# Patient Record
Sex: Male | Born: 2000 | Race: Black or African American | Hispanic: No | Marital: Single | Smoking: Never smoker
Health system: Southern US, Community
[De-identification: ages and names within clinical notes are randomized; demographics above are authoritative.]

---
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
## 2020-05-16 ENCOUNTER — Emergency Department (HOSPITAL_BASED_OUTPATIENT_CLINIC_OR_DEPARTMENT_OTHER)
Admission: EM | Admit: 2020-05-16 | Discharge: 2020-05-16 | Disposition: A | Discharge status: Home / Self Care | Payer: 59 | Attending: Emergency Medicine | Admitting: Emergency Medicine

## 2020-05-16 ENCOUNTER — Other Ambulatory Visit: Payer: Self-pay

## 2020-05-16 ENCOUNTER — Encounter (HOSPITAL_BASED_OUTPATIENT_CLINIC_OR_DEPARTMENT_OTHER): Payer: Self-pay | Admitting: Emergency Medicine

## 2020-05-16 DIAGNOSIS — A749 Chlamydial infection, unspecified: Secondary | ICD-10-CM | POA: Diagnosis present

## 2020-05-16 DIAGNOSIS — Z202 Contact with and (suspected) exposure to infections with a predominantly sexual mode of transmission: Secondary | ICD-10-CM | POA: Diagnosis not present

## 2020-05-16 DIAGNOSIS — Z91040 Latex allergy status: Secondary | ICD-10-CM | POA: Insufficient documentation

## 2020-05-16 MED ORDER — LIDOCAINE HCL (PF) 1 % IJ SOLN
INTRAMUSCULAR | Status: AC
  Filled 2020-05-16: qty 5

## 2020-05-16 MED ORDER — CEFTRIAXONE SODIUM 500 MG IJ SOLR
500.0000 mg | Freq: Once | INTRAMUSCULAR | Status: AC
  Administered 2020-05-16: 500 mg via INTRAMUSCULAR
  Filled 2020-05-16: qty 500

## 2020-05-16 MED ORDER — DOXYCYCLINE HYCLATE 100 MG PO CAPS: 100.0000 mg | ORAL_CAPSULE | Freq: Two times a day (BID) | ORAL | 0 refills | Status: AC

## 2020-05-16 NOTE — Discharge Instructions (Addendum)
You have been treated for gonorrhea and chlamydia.  Please finish treatment course before engaging in any sexual activity.  Please consider condoms.  Make sure any sex partners are told that they need treatment as well.

## 2020-05-16 NOTE — ED Provider Notes (Signed)
[facility] EMERGENCY DEPARTMENT Provider Note   CSN: [id number] Arrival date & time: 05/16/20  1345     History Chief Complaint  Patient presents with   STD Check    Omar Knight is a 19 y.o. male.  Patient here for treatment for chlamydia.  Has had some discharge.  The history is provided by the patient.  Exposure to STD This is a new problem. The problem occurs constantly. The problem has not changed since onset.Nothing aggravates the symptoms. Nothing relieves the symptoms. He has tried nothing for the symptoms. The treatment provided no relief.       History reviewed. No pertinent past medical history.  There are no problems to display for this patient.   History reviewed. No pertinent surgical history.     History reviewed. No pertinent family history.  Social History   Tobacco Use   Smoking status: Never Smoker   Smokeless tobacco: Never Used  Substance Use Topics   Alcohol use: No   Drug use: No    Home Medications Prior to Admission medications   Medication Sig Start Date End Date Taking? Authorizing Provider  doxycycline (VIBRAMYCIN) 100 MG capsule Take 1 capsule (100 mg total) by mouth 2 (two) times daily for 7 days. 05/16/20 05/23/20  Omar Norfolk, DO    Allergies    Latex  Review of Systems   Review of Systems  Genitourinary: Positive for discharge and dysuria. Negative for decreased urine volume, difficulty urinating, frequency, genital sores, penile pain, penile swelling, scrotal swelling, testicular pain and urgency.  Skin: Negative for color change, pallor, rash and wound.    Physical Exam Updated Vital Signs  ED Triage Vitals  Enc Vitals Group     BP 05/16/20 1358 111/61     Pulse Rate 05/16/20 1358 63     Resp 05/16/20 1358 18     Temp 05/16/20 1358 99.1 F (37.3 C)     Temp Source 05/16/20 1358 Oral     SpO2 05/16/20 1358 100 %     Weight --      Height --      Head Circumference --      Peak Flow --        Pain Score 05/16/20 1359 0     Pain Loc --      Pain Edu? --      Excl. in GC? --     Physical Exam Constitutional:      General: He is not in acute distress.    Appearance: He is not ill-appearing.  Genitourinary:    Penis: Normal.      Testes: Normal.        Right: Mass or tenderness not present.        Left: Mass or tenderness not present.     Epididymis:     Right: Normal.     Left: Normal.  Skin:    General: Skin is warm.     Findings: No lesion or rash.  Neurological:     Mental Status: He is alert.     ED Results / Procedures / Treatments   Labs (all labs ordered are listed, but only abnormal results are displayed) Labs Reviewed  GC/CHLAMYDIA PROBE AMP ([place]) NOT AT [facility]    EKG None  Radiology No results found.  Procedures Procedures (including critical care time)  Medications Ordered in ED Medications  cefTRIAXone (ROCEPHIN) injection 500 mg (has no administration in time range)    ED Course  I have reviewed the triage vital signs and the nursing notes.  Pertinent labs & imaging results that were available during my care of the patient were reviewed by me and considered in my medical decision making (see chart for details).    MDM Rules/Calculators/A&P                          Omar Knight is a 19 year old male who presents to the ED for treatment for chlamydia.  Normal vitals.  No fever.  States that he had exposure to chlamydia.  Has had some discharge.  GU exam is overall unremarkable.  Treated with Rocephin and doxycycline.  Educated about STDs and safe sex.  Knows that sex partners need to be treated and that he needs to complete treatment prior to any sexual activity.  Discharged in good condition.  This chart was dictated using voice recognition software.  Despite best efforts to proofread,  errors can occur which can change the documentation meaning.    Final Clinical Impression(s) / ED Diagnoses Final diagnoses:  Exposure to  STD    Rx / DC Orders ED Discharge Orders         Ordered    doxycycline (VIBRAMYCIN) 100 MG capsule  2 times daily        05/16/20 1430           Omar Knight, Omar Bhat, DO 05/16/20 1438

## 2020-05-16 NOTE — ED Triage Notes (Signed)
Exposure to unknown STD. Discharge x 1 week.

## 2020-05-16 NOTE — ED Notes (Signed)
Tolerated IM injection well, AVS and Rx provided to client, discussed safe sex practices and also to refrain from sexual activity until completion of Rx provided by provider. Opportunity for questions provided. Copy of AVS provided

## 2020-05-18 LAB — GC/CHLAMYDIA PROBE AMP (~~LOC~~) NOT AT ARMC
Chlamydia: POSITIVE — AB
Comment: NEGATIVE
Comment: NORMAL
Neisseria Gonorrhea: POSITIVE — AB

## 2020-09-17 ENCOUNTER — Emergency Department (HOSPITAL_BASED_OUTPATIENT_CLINIC_OR_DEPARTMENT_OTHER)
Admission: EM | Admit: 2020-09-17 | Discharge: 2020-09-17 | Disposition: A | Discharge status: Home / Self Care | Payer: 59 | Attending: Emergency Medicine | Admitting: Emergency Medicine

## 2020-09-17 ENCOUNTER — Other Ambulatory Visit: Payer: Self-pay

## 2020-09-17 ENCOUNTER — Encounter (HOSPITAL_BASED_OUTPATIENT_CLINIC_OR_DEPARTMENT_OTHER): Payer: Self-pay | Admitting: *Deleted

## 2020-09-17 DIAGNOSIS — Z202 Contact with and (suspected) exposure to infections with a predominantly sexual mode of transmission: Secondary | ICD-10-CM | POA: Diagnosis not present

## 2020-09-17 DIAGNOSIS — Z91040 Latex allergy status: Secondary | ICD-10-CM | POA: Insufficient documentation

## 2020-09-17 LAB — URINALYSIS, ROUTINE W REFLEX MICROSCOPIC
Bilirubin Urine: NEGATIVE
Glucose, UA: NEGATIVE mg/dL
Hgb urine dipstick: NEGATIVE
Ketones, ur: NEGATIVE mg/dL
Leukocytes,Ua: NEGATIVE
Nitrite: NEGATIVE
Protein, ur: NEGATIVE mg/dL
Specific Gravity, Urine: 1.005 (ref 1.005–1.030)
pH: 8.5 — ABNORMAL HIGH (ref 5.0–8.0)

## 2020-09-17 MED ORDER — DOXYCYCLINE HYCLATE 100 MG PO TABS
100.0000 mg | ORAL_TABLET | Freq: Once | ORAL | Status: AC
  Administered 2020-09-17: 100 mg via ORAL
  Filled 2020-09-17: qty 1

## 2020-09-17 MED ORDER — CEFTRIAXONE SODIUM 500 MG IJ SOLR
500.0000 mg | Freq: Once | INTRAMUSCULAR | Status: AC
  Administered 2020-09-17: 500 mg via INTRAMUSCULAR
  Filled 2020-09-17: qty 500

## 2020-09-17 MED ORDER — DOXYCYCLINE HYCLATE 100 MG PO CAPS: 100.0000 mg | ORAL_CAPSULE | Freq: Two times a day (BID) | ORAL | 0 refills | Status: AC

## 2020-09-17 NOTE — ED Provider Notes (Addendum)
[facility] EMERGENCY DEPARTMENT Provider Note   CSN: [id number] Arrival date & time: 09/17/20  1846     History Chief Complaint  Patient presents with  . Exposure to STD    Omar Knight is a 20 y.o. male without significant PMHx, presenting to the ED with complaint of exposure to chlamydia. He is sexually active with male partners, last encounter was without protection. Denies any associated symptoms.  Specifically asked about abdominal pain which was mentioned on intake, patient denies.   The history is provided by the patient.       History reviewed. No pertinent past medical history.  There are no problems to display for this patient.   History reviewed. No pertinent surgical history.     No family history on file.  Social History   Tobacco Use  . Smoking status: Never Smoker  . Smokeless tobacco: Never Used  Substance Use Topics  . Alcohol use: No  . Drug use: No    Home Medications Prior to Admission medications   Medication Sig Start Date End Date Taking? Authorizing Provider  doxycycline (VIBRAMYCIN) 100 MG capsule Take 1 capsule (100 mg total) by mouth 2 (two) times daily for 7 days. 09/17/20 09/24/20 Yes Trebor Galdamez, [place], PA-C    Allergies    Latex  Review of Systems   Review of Systems  All other systems reviewed and are negative.   Physical Exam Updated Vital Signs BP (!) 131/52 (BP Location: Right Arm)   Pulse 81   Temp 98.5 F (36.9 C) (Oral)   Resp 20   Ht 5\' 8"  (1.727 m)   Wt 55.3 kg   SpO2 99%   BMI 18.55 kg/m   Physical Exam Vitals and nursing note reviewed. Exam conducted with a chaperone present.  Constitutional:      General: He is not in acute distress.    Appearance: He is well-developed.  HENT:     Head: Normocephalic and atraumatic.  Eyes:     Conjunctiva/sclera: Conjunctivae normal.  Cardiovascular:     Rate and Rhythm: Normal rate.  Pulmonary:     Effort: Pulmonary effort is normal.   Genitourinary:    Penis: Circumcised.      Testes: Normal.     Epididymis:     Right: Normal.     Left: Normal.     Comments: Clear discharge present at the urethral meatus No tenderness to palpation of the penis, no rashes or lesions. No testicular/epididymal tenderness or swelling.  Scrotum appears normal. Neurological:     Mental Status: He is alert.  Psychiatric:        Mood and Affect: Mood normal.        Behavior: Behavior normal.     ED Results / Procedures / Treatments   Labs (all labs ordered are listed, but only abnormal results are displayed) Labs Reviewed  URINALYSIS, ROUTINE W REFLEX MICROSCOPIC - Abnormal; Notable for the following components:      Result Value   APPearance HAZY (*)    pH 8.5 (*)    All other components within normal limits  GC/CHLAMYDIA PROBE AMP ([place]) NOT AT [facility]    EKG None  Radiology No results found.  Procedures Procedures   Medications Ordered in ED Medications  cefTRIAXone (ROCEPHIN) injection 500 mg (has no administration in time range)  doxycycline (VIBRA-TABS) tablet 100 mg (has no administration in time range)    ED Course  I have reviewed the triage vital signs and  the nursing notes.  Pertinent labs & imaging results that were available during my care of the patient were reviewed by me and considered in my medical decision making (see chart for details).    MDM Rules/Calculators/A&P                          Patient is asymptomatic, presenting for exposure to chlamydia. .  No tenderness to palpation of the testes or epididymis to suggest orchitis or epididymitis.  STD cultures obtained including gonorrhea and chlamydia.  UA neg for trich. Per review of medical record, patient's STD screening for gonorrhea and chlamydia were both positive in October 2021.  He states he completed his treatment for which she was prescribed a dose of IM Rocephin and p.o. doxycycline.    Patient to be discharged with instructions to  follow up with PCP. Discussed importance of using protection when sexually active. Pt understands that they have GC/Chlamydia cultures pending and that they will need to inform all sexual partners if results return positive. Patient has been treated prophylactically today with doxycycline and Rocephin.   Discussed results, findings, treatment and follow up. Patient advised of return precautions. Patient verbalized understanding and agreed with plan.  Final Clinical Impression(s) / ED Diagnoses Final diagnoses:  Exposure to STD    Rx / DC Orders ED Discharge Orders         Ordered    doxycycline (VIBRAMYCIN) 100 MG capsule  2 times daily        09/17/20 1924           Rande Dario, [place], PA-C 09/17/20 1922    Ardis Lawley, [place] N, [place] 09/17/20 1925    Virgina Norfolk, DO 09/17/20 1937
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
## 2020-09-17 NOTE — Discharge Instructions (Addendum)
Please consider practicing safe sexual activity to prevent the potential of contracting lifelong and life-threatening sexually transmitted diseases. Talk with your primary care provider about any new medications.  You have begun treatment today for gonorrhea and chlamydia.  It is important you finish the antibiotic, doxycycline, to complete your treatment.  Take this once every 12 hours until gone.  It is important that you protect your skin from the sun if you plan for any prolonged sun exposure while taking this medication. A side effect of this medication includes increasing your skin sensitivity to UV rays and can result in rash. You will receive a call from the hospital if your test results come back positive. Avoid sexual activity until you know your test results. If your results come back positive, it is important that you inform all of your sexual partners.  Return to the ER for new pain with bowel movements, abdominal pain, fever, swelling/pain in your testicles, or new or worsening symptoms.

## 2020-09-17 NOTE — ED Triage Notes (Signed)
STD exposure. Denies penile discharge.

## 2020-09-18 LAB — GC/CHLAMYDIA PROBE AMP (~~LOC~~) NOT AT ARMC
Chlamydia: NEGATIVE
Comment: NEGATIVE
Comment: NORMAL
Neisseria Gonorrhea: NEGATIVE

## 2022-11-19 ENCOUNTER — Encounter (HOSPITAL_BASED_OUTPATIENT_CLINIC_OR_DEPARTMENT_OTHER): Payer: Self-pay | Admitting: Emergency Medicine

## 2022-11-19 ENCOUNTER — Emergency Department (HOSPITAL_BASED_OUTPATIENT_CLINIC_OR_DEPARTMENT_OTHER)
Admission: EM | Admit: 2022-11-19 | Discharge: 2022-11-19 | Disposition: A | Discharge status: Home / Self Care | Payer: 59 | Attending: Emergency Medicine | Admitting: Emergency Medicine

## 2022-11-19 DIAGNOSIS — H16001 Unspecified corneal ulcer, right eye: Secondary | ICD-10-CM | POA: Diagnosis not present

## 2022-11-19 DIAGNOSIS — H5711 Ocular pain, right eye: Secondary | ICD-10-CM | POA: Diagnosis present

## 2022-11-19 MED ORDER — FLUORESCEIN SODIUM 1 MG OP STRP
1.0000 | ORAL_STRIP | Freq: Once | OPHTHALMIC | Status: AC
  Administered 2022-11-19: 1 via OPHTHALMIC
  Filled 2022-11-19: qty 1

## 2022-11-19 MED ORDER — CIPROFLOXACIN HCL 0.3 % OP SOLN: 2.0000 [drp] | OPHTHALMIC | 0 refills | Status: AC

## 2022-11-19 MED ORDER — TETRACAINE HCL 0.5 % OP SOLN
2.0000 [drp] | Freq: Once | OPHTHALMIC | Status: AC
  Administered 2022-11-19: 2 [drp] via OPHTHALMIC
  Filled 2022-11-19: qty 4

## 2022-11-19 NOTE — Discharge Instructions (Signed)
You were seen in the emergency department for eye pain. Based on fluorescein staining, there appears to be a corneal ulcer present on your right eye. For this, I will treat you with antibiotic eye drops to manage this. You need to follow up with ophthalmology within 2 days to ensure your condition is not worsening. I have provided you with information for Dr. Sherrine Maples at [facility] in [place].
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
